# Patient Record
Sex: Male | Born: 1935 | Race: White | Hispanic: No | Marital: Married
Health system: Southern US, Community
[De-identification: ages and names within clinical notes are randomized; demographics above are authoritative.]

---

## 2005-09-06 ENCOUNTER — Other Ambulatory Visit: Payer: Self-pay

## 2005-09-06 ENCOUNTER — Inpatient Hospital Stay: Payer: Self-pay | Admitting: Internal Medicine

## 2005-10-09 ENCOUNTER — Emergency Department: Payer: Self-pay | Admitting: Emergency Medicine

## 2014-02-14 ENCOUNTER — Observation Stay: Payer: Self-pay | Admitting: Internal Medicine

## 2014-02-14 LAB — CBC WITH DIFFERENTIAL/PLATELET
BASOS PCT: 0.8 %
Basophil #: 0 10*3/uL (ref 0.0–0.1)
Eosinophil #: 0.3 10*3/uL (ref 0.0–0.7)
Eosinophil %: 5.3 %
HCT: 41.3 % (ref 40.0–52.0)
HGB: 14.2 g/dL (ref 13.0–18.0)
Lymphocyte #: 1.1 10*3/uL (ref 1.0–3.6)
Lymphocyte %: 19.7 %
MCH: 32.2 pg (ref 26.0–34.0)
MCHC: 34.4 g/dL (ref 32.0–36.0)
MCV: 94 fL (ref 80–100)
Monocyte #: 0.5 x10 3/mm (ref 0.2–1.0)
Monocyte %: 8.4 %
NEUTROS ABS: 3.8 10*3/uL (ref 1.4–6.5)
Neutrophil %: 65.8 %
PLATELETS: 213 10*3/uL (ref 150–440)
RBC: 4.41 10*6/uL (ref 4.40–5.90)
RDW: 13.5 % (ref 11.5–14.5)
WBC: 5.8 10*3/uL (ref 3.8–10.6)

## 2014-02-14 LAB — BASIC METABOLIC PANEL
Anion Gap: 7 (ref 7–16)
BUN: 29 mg/dL — ABNORMAL HIGH (ref 7–18)
CHLORIDE: 97 mmol/L — AB (ref 98–107)
CO2: 28 mmol/L (ref 21–32)
Calcium, Total: 8.9 mg/dL (ref 8.5–10.1)
Creatinine: 1.79 mg/dL — ABNORMAL HIGH (ref 0.60–1.30)
EGFR (Non-African Amer.): 32 — ABNORMAL LOW
GFR CALC AF AMER: 37 — AB
GLUCOSE: 290 mg/dL — AB (ref 65–99)
OSMOLALITY: 281 (ref 275–301)
Potassium: 4 mmol/L (ref 3.5–5.1)
SODIUM: 132 mmol/L — AB (ref 136–145)

## 2014-02-14 LAB — URINALYSIS, COMPLETE
BILIRUBIN, UR: NEGATIVE
Bacteria: NONE SEEN
Hyaline Cast: 2
Leukocyte Esterase: NEGATIVE
Nitrite: NEGATIVE
PROTEIN: NEGATIVE
Ph: 6 (ref 4.5–8.0)
RBC,UR: 3 /HPF (ref 0–5)
Specific Gravity: 1.014 (ref 1.003–1.030)
Squamous Epithelial: NONE SEEN
WBC UR: <1 /HPF (ref 0–5)

## 2014-02-14 LAB — TROPONIN I: Troponin-I: 0.03 ng/mL

## 2014-02-15 LAB — CBC WITH DIFFERENTIAL/PLATELET
Basophil #: 0.1 10*3/uL (ref 0.0–0.1)
Basophil %: 1.8 %
Eosinophil #: 0.2 10*3/uL (ref 0.0–0.7)
Eosinophil %: 3.2 %
HCT: 38.1 % — AB (ref 40.0–52.0)
HGB: 13.3 g/dL (ref 13.0–18.0)
Lymphocyte #: 1.2 10*3/uL (ref 1.0–3.6)
Lymphocyte %: 17.3 %
MCH: 32.2 pg (ref 26.0–34.0)
MCHC: 35 g/dL (ref 32.0–36.0)
MCV: 92 fL (ref 80–100)
MONOS PCT: 8.5 %
Monocyte #: 0.6 x10 3/mm (ref 0.2–1.0)
Neutrophil #: 5 10*3/uL (ref 1.4–6.5)
Neutrophil %: 69.2 %
Platelet: 191 10*3/uL (ref 150–440)
RBC: 4.14 10*6/uL — AB (ref 4.40–5.90)
RDW: 13.1 % (ref 11.5–14.5)
WBC: 7.2 10*3/uL (ref 3.8–10.6)

## 2014-02-15 LAB — COMPREHENSIVE METABOLIC PANEL
ALBUMIN: 3.3 g/dL — AB (ref 3.4–5.0)
ALT: 19 U/L (ref 12–78)
ANION GAP: 1 — AB (ref 7–16)
Alkaline Phosphatase: 72 U/L
BUN: 26 mg/dL — ABNORMAL HIGH (ref 7–18)
Bilirubin,Total: 0.5 mg/dL (ref 0.2–1.0)
Calcium, Total: 8.8 mg/dL (ref 8.5–10.1)
Chloride: 99 mmol/L (ref 98–107)
Co2: 35 mmol/L — ABNORMAL HIGH (ref 21–32)
Creatinine: 1.64 mg/dL — ABNORMAL HIGH (ref 0.60–1.30)
EGFR (Non-African Amer.): 36 — ABNORMAL LOW
GFR CALC AF AMER: 41 — AB
Glucose: 122 mg/dL — ABNORMAL HIGH (ref 65–99)
Osmolality: 276 (ref 275–301)
Potassium: 3.4 mmol/L — ABNORMAL LOW (ref 3.5–5.1)
SGOT(AST): 19 U/L (ref 15–37)
SODIUM: 135 mmol/L — AB (ref 136–145)
Total Protein: 6.4 g/dL (ref 6.4–8.2)

## 2014-02-15 LAB — HEMOGLOBIN A1C: Hemoglobin A1C: 8.3 % — ABNORMAL HIGH (ref 4.2–6.3)

## 2014-02-15 LAB — TROPONIN I: Troponin-I: 0.04 ng/mL

## 2014-02-15 LAB — PROTIME-INR
INR: 1
Prothrombin Time: 13.4 secs (ref 11.5–14.7)

## 2014-02-15 LAB — TSH: THYROID STIMULATING HORM: 2.24 u[IU]/mL

## 2014-02-15 LAB — MAGNESIUM: Magnesium: 1.9 mg/dL

## 2014-02-16 LAB — BASIC METABOLIC PANEL
ANION GAP: 6 — AB (ref 7–16)
BUN: 29 mg/dL — AB (ref 7–18)
CALCIUM: 8.6 mg/dL (ref 8.5–10.1)
CHLORIDE: 104 mmol/L (ref 98–107)
CO2: 27 mmol/L (ref 21–32)
CREATININE: 1.51 mg/dL — AB (ref 0.60–1.30)
EGFR (African American): 46 — ABNORMAL LOW
EGFR (Non-African Amer.): 40 — ABNORMAL LOW
GLUCOSE: 143 mg/dL — AB (ref 65–99)
Osmolality: 282 (ref 275–301)
Potassium: 4.5 mmol/L (ref 3.5–5.1)
SODIUM: 137 mmol/L (ref 136–145)

## 2014-02-16 LAB — CBC WITH DIFFERENTIAL/PLATELET
BASOS ABS: 0.1 10*3/uL (ref 0.0–0.1)
BASOS PCT: 1.8 %
EOS ABS: 0.4 10*3/uL (ref 0.0–0.7)
Eosinophil %: 6.5 %
HCT: 37.8 % — AB (ref 40.0–52.0)
HGB: 12.8 g/dL — ABNORMAL LOW (ref 13.0–18.0)
LYMPHS ABS: 1.4 10*3/uL (ref 1.0–3.6)
Lymphocyte %: 21 %
MCH: 31.6 pg (ref 26.0–34.0)
MCHC: 33.8 g/dL (ref 32.0–36.0)
MCV: 94 fL (ref 80–100)
Monocyte #: 0.7 x10 3/mm (ref 0.2–1.0)
Monocyte %: 11 %
NEUTROS PCT: 59.7 %
Neutrophil #: 3.9 10*3/uL (ref 1.4–6.5)
Platelet: 175 10*3/uL (ref 150–440)
RBC: 4.05 10*6/uL — AB (ref 4.40–5.90)
RDW: 13.4 % (ref 11.5–14.5)
WBC: 6.5 10*3/uL (ref 3.8–10.6)

## 2014-03-30 ENCOUNTER — Ambulatory Visit: Payer: Self-pay | Admitting: Internal Medicine

## 2014-05-09 DEATH — deceased

## 2015-03-02 NOTE — H&P (Signed)
PATIENT NAME:  Jason Sanford, Jason Sanford MR#:  [id number] DATE OF BIRTH:  [birth year]  DATE OF ADMISSION:  02/14/2014  PRIMARY CARE PHYSICIAN:  Dr. Aram Beecham  REQUESTING PHYSICIAN:  Dr. Governor Rooks  CHIEF COMPLAINT: Dizziness.   HISTORY OF PRESENT ILLNESS: The patient is a 81 year old male with a known history of diabetes, hypertension. He is being admitted for possible TIA/presyncope. The patient lives alone at his home, was working outside, and was out there about 10 to 15 minutes, where he was found to be slumped over on the side, was feeling very lightheaded. He was sitting on the steps out on his porch, and his son was visiting, who called EMS. By that time, the patient was already back to normal. The whole episode lasted about 10 to 15 minutes. The patient was brought down to the Emergency Department. While in the ED, he was found to have elevated creatinine, and is being admitted for further evaluation and management.   PAST MEDICAL HISTORY:  1.  Hypertension.   2.  Diabetes.   ALLERGIES: No known drug allergies.   MEDICATIONS AT HOME:   1.  Aspirin 325 mg p.o. daily. 2.  Captopril/HCTZ 50/15 mg 2 tablets p.o. every morning and 1 tablet every evening.  3.  Glipizide/metformin 5/500 mg 1/2 tablet twice a day. 4.  Simvastatin 40 mg p.o. at bedtime.   SOCIAL HISTORY: No smoking. No alcohol. No IV drugs of abuse. He lives alone.   FAMILY HISTORY: Unremarkable.   REVIEW OF SYSTEMS: CONSTITUTIONAL: No fever. Positive fatigue and weakness.  EYES: He does get some blurry vision from time to time, and is needing to get his eyes checked.  ENT: No tinnitus or ear pain. He does have hearing loss.  RESPIRATORY: No cough, wheezing, hemoptysis.  CARDIOVASCULAR: No chest pain, orthopnea, edema.  GASTROINTESTINAL: No nausea, vomiting, diarrhea. GENITOURINARY:  No dysuria or hematuria.  ENDOCRINE: No polyuria or nocturia.  HEMATOLOGIC:  No anemia or easy bruising. SKIN: No rash or lesion.   MUSCULOSKELETAL: No arthritis or muscle cramp.  NEUROLOGIC: No tingling or numbness. He did feel weak and lightheaded.  PSYCHIATRIC: No history of anxiety or depression.    PHYSICAL EXAMINATION: VITAL SIGNS: Temperature 98.2, heart rate 104 per minute, respirations 16 per minute, blood pressure 145/84 mmHg, he is saturating 98% on room air.  GENERAL: The patient is a 81 year old male lying in bed without any acute distress.  EYES: Pupils equal, round, react to light and accommodation. No scleral icterus.  Extraocular muscles intact. HEENT:  Head atraumatic, normocephalic.  Oropharynx and nasopharynx clear. NECK: Supple. No jugular venous distention.No thyroid tenderness  LUNGS: Clear to auscultation bilaterally. No wheezing, rales, rhonchi or crepitation.  CARDIOVASCULAR: S1, S2 normal. No murmurs. ABDOMEN: Soft, nontender, nondistended. Bowel sounds present. No organomegaly or mass.   EXTREMITIES: No pedal edema, cyanosis, clubbing.  NEUROLOGIC: Cranial nerves II through XII intact. Muscle strength 5/5 in extremities. Sensation intact.  PSYCHIATRIC: The patient is alert and oriented x 3.  SKIN: No obvious rash, lesion, or ulcer.   LABORATORY PANEL: Normal CBC. Normal first set of troponins. UA was negative. BMP showed sodium 132, potassium 4.0, chloride 97, CO2 of 28, BUN 29, creatinine 1.79, blood sugar 290.   CT scan of the head in the ED showed no acute intracranial abnormalities, chronic microvascular ischemic changes.   EKG showed no major ST-T changes. No old EKG to compare with.   IMPRESSION AND PLAN: 1.  Presyncope, likely from dehydration. Certainly could have  orthostatic. Cannot rule out hypoglycemia, although his blood sugars seem to be running high here, at least. Will hold his oral anti-hypoglycemic medications. Start him on sliding scale insulin. Will get PT, OT consults, for further evaluation, will get serial troponins and monitor him on telemetry. Hydrate him with IV  fluids.  2.  Possible transient ischemic attack, will get MRI of the brain, carotid Dopplers, 2-D echo, and monitor him on telemetry. Get serial troponins.   3.  Acute renal failure, likely prerenal. Will start him on IV hydration and monitor his renal function. Hold off his diuretic and other blood pressure medication, along with metformin.   4.  Diabetes. Will hold his oral anti-hypoglycemic medication, start him on sliding scale insulin for now.  CODE STATUS: FULL CODE.   Total time taking care of this patient was 35 minutes.   ____________________________ Ellamae SiaVipul S. Sherryll BurgerShah, MD vss:mr D: 02/14/2014 20:05:41 ET T: 02/14/2014 20:53:43 ET JOB#: [id number]  cc: Tatym Schermer S. Sherryll BurgerShah, MD, <Dictator> Duane LopeJeffrey D. Judithann SheenSparks, MD  Patricia PesaVIPUL S Elsbeth Yearick MD ELECTRONICALLY SIGNED 02/17/2014 0:15

## 2015-10-05 IMAGING — CT CT HEAD WITHOUT CONTRAST
1 series · 16 of 30 positions shown, 20 images · non-contrast
Comparison: 02/15/2014 MR.  02/14/2014 CT

CLINICAL DATA: Possible transient ischemic attack.

EXAM:
CT HEAD WITHOUT CONTRAST
TECHNIQUE: Contiguous axial images were obtained from the base of the skull
through the vertex without contrast.

[Series 2: head wo · axial · 0.41mm/px · z∈[+1186,+1330]mm · 16 of 36 slices shown, 20 images]
[im 2/36  brain]
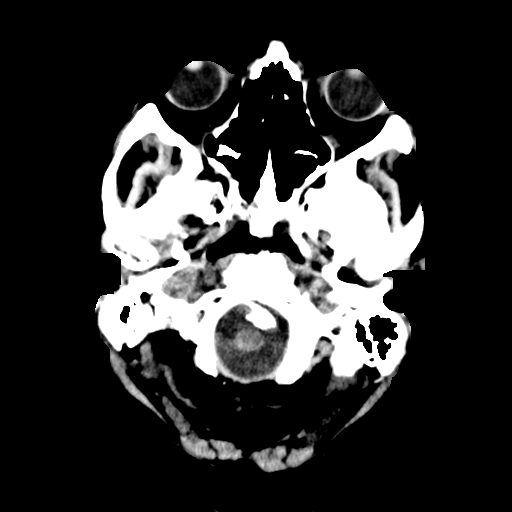
[im 2/36  bone]
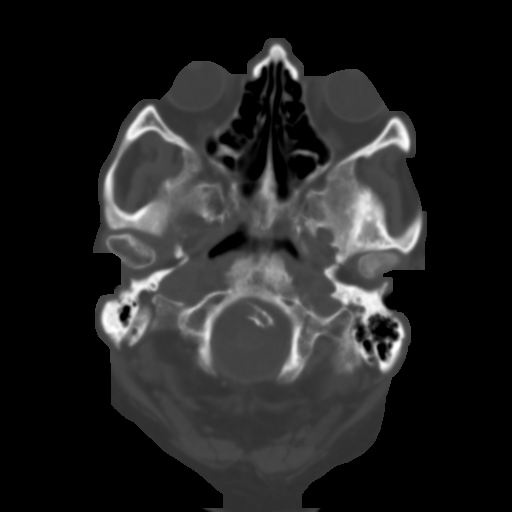
[im 4/36  brain]
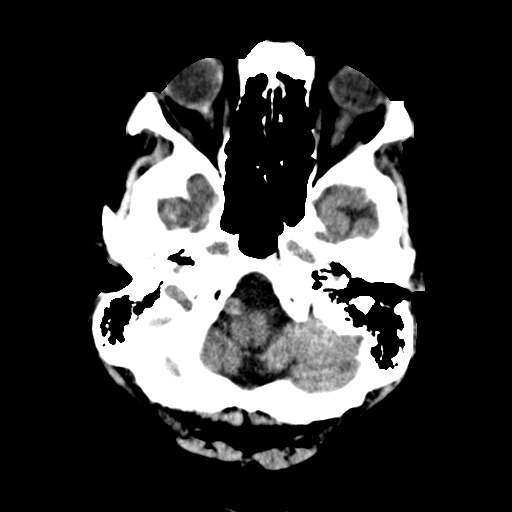
[im 7/36  brain]
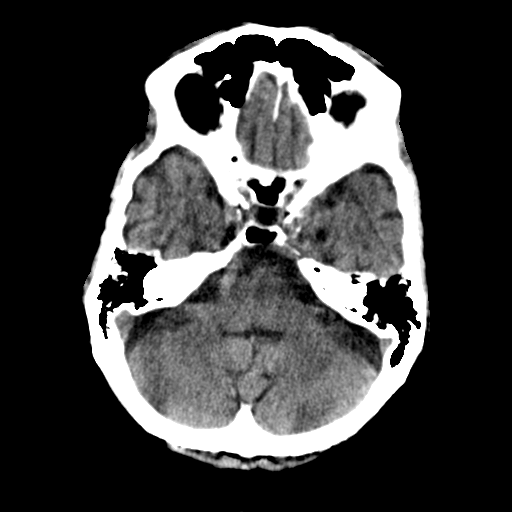
[im 9/36  brain]
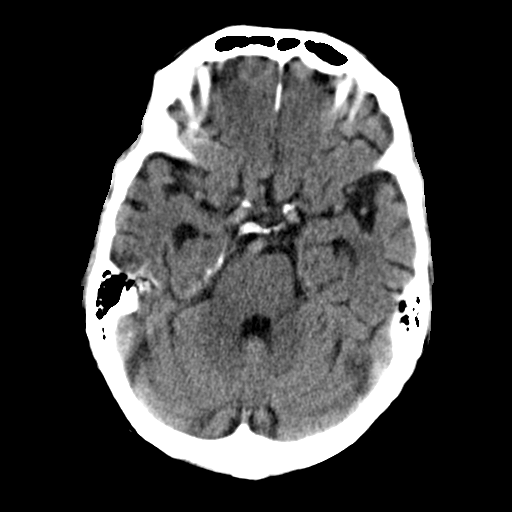
[im 10/36  brain]
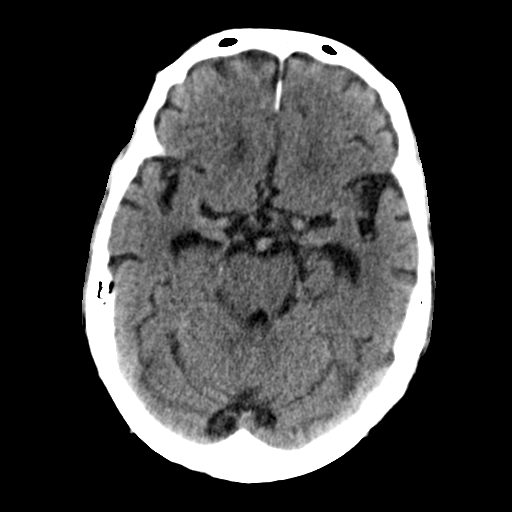
[im 10/36  bone]
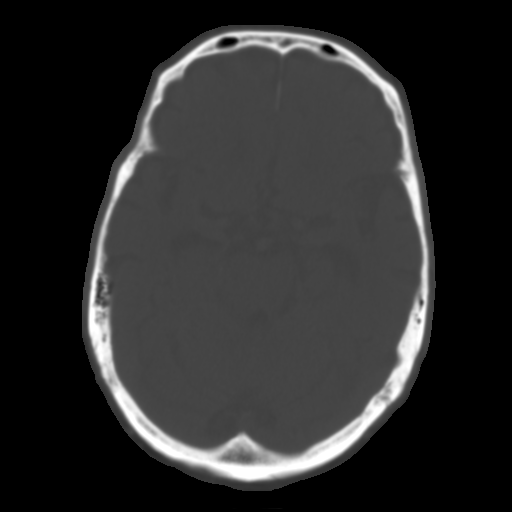
[im 13/36  brain]
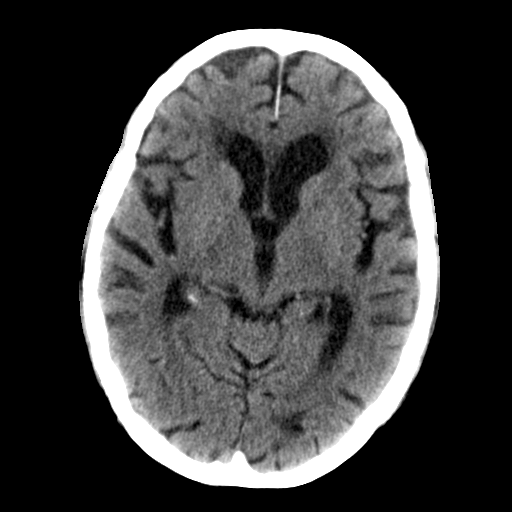
[im 15/36  brain]
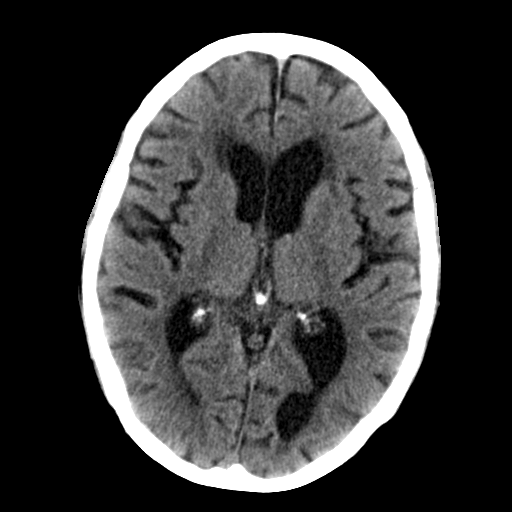
[im 17/36  brain]
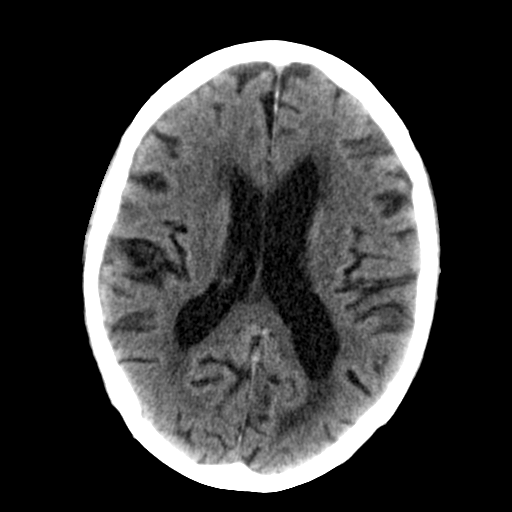
[im 19/36  brain]
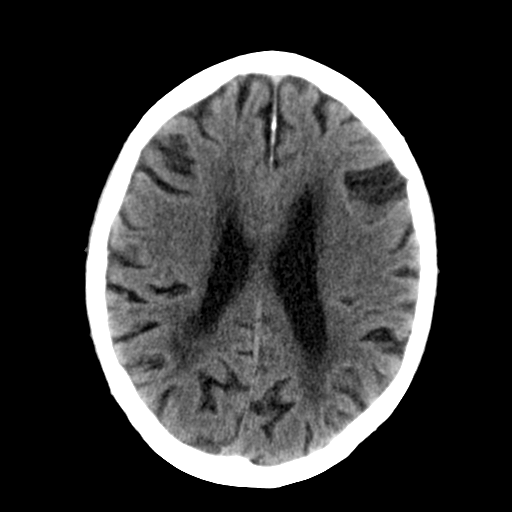
[im 19/36  bone]
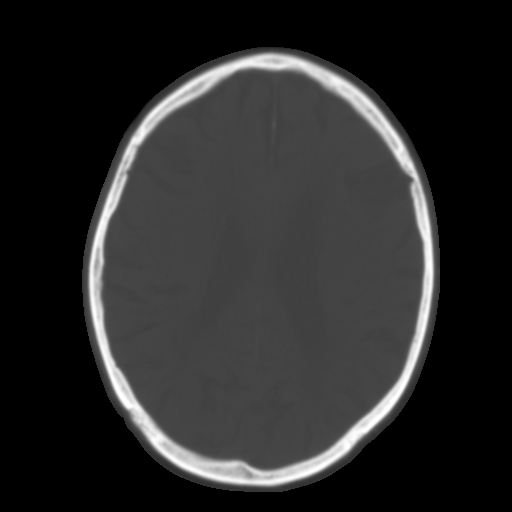
[im 21/36  brain]
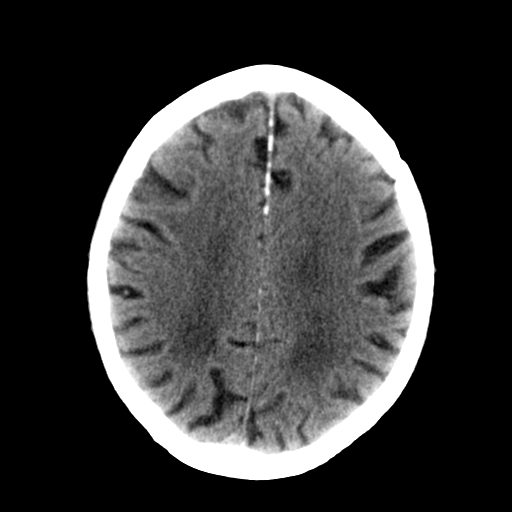
[im 23/36  brain]
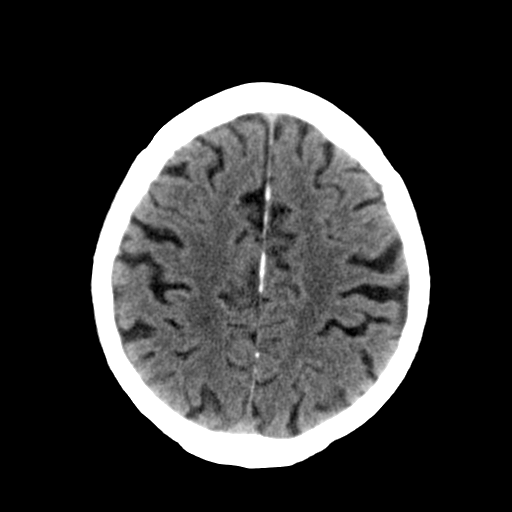
[im 26/36  brain]
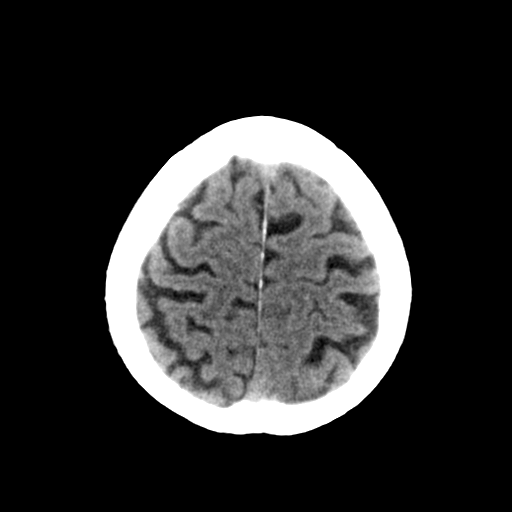
[im 27/36  brain]
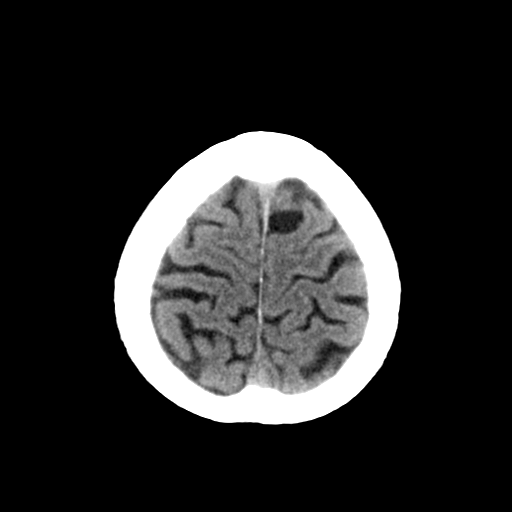
[im 27/36  bone]
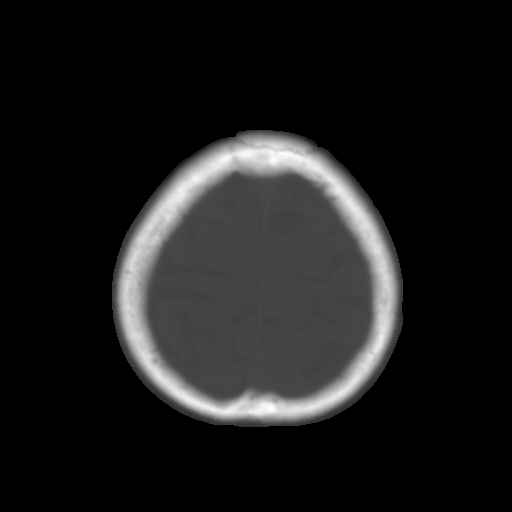
[im 29/36  brain]
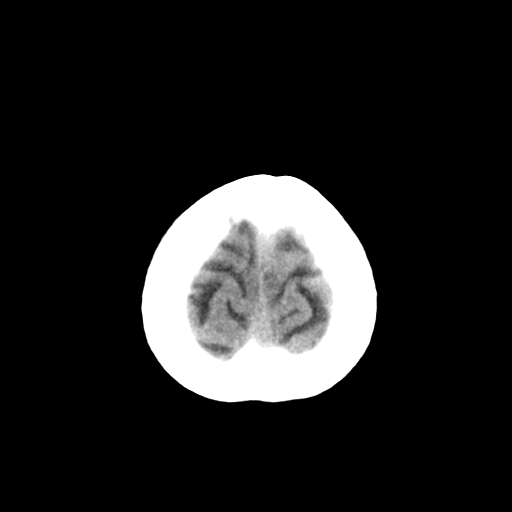
[im 32/36  brain]
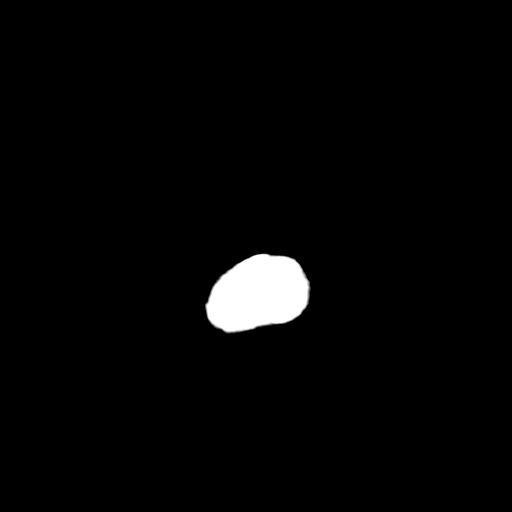
[im 34/36  brain]
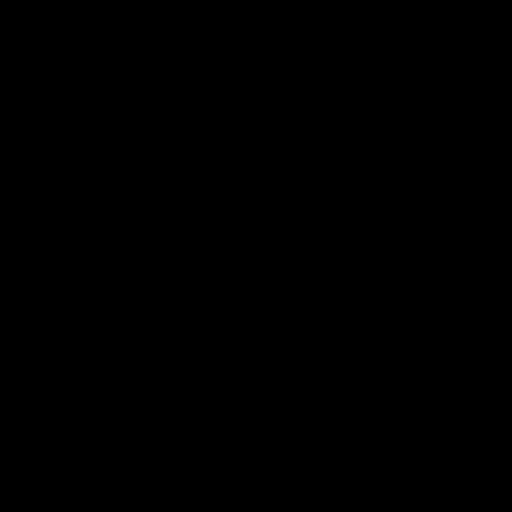

[16 of 30 positions shown; findings below may reference images not displayed]

FINDINGS: Moderate generalized atrophy. Chronic microvascular ischemic change.
No definite acute stroke, hemorrhage, mass lesion, hydrocephalus, or
extra-axial fluid. No CT signs of proximal vascular thrombosis.
Advanced vascular calcification, particularly the left vertebral,
basilar, and bilateral carotid siphons. Calvarium is intact. No
acute sinus or mastoid disease. Similar appearance to prior CT and
MR.
IMPRESSION: No acute intracranial abnormality. Generalized atrophy and small
vessel disease.
